# Patient Record
Sex: Male | Born: 1985 | Race: Black or African American | Hispanic: No | Marital: Single | Smoking: Never smoker
Health system: Southern US, Community
[De-identification: ages and names within clinical notes are randomized; demographics above are authoritative.]

---

## 2021-05-14 DIAGNOSIS — L910 Hypertrophic scar: Secondary | ICD-10-CM | POA: Diagnosis not present

## 2021-06-25 DIAGNOSIS — J101 Influenza due to other identified influenza virus with other respiratory manifestations: Secondary | ICD-10-CM | POA: Diagnosis not present

## 2021-06-25 DIAGNOSIS — R059 Cough, unspecified: Secondary | ICD-10-CM | POA: Diagnosis not present

## 2021-10-18 DIAGNOSIS — H1033 Unspecified acute conjunctivitis, bilateral: Secondary | ICD-10-CM | POA: Diagnosis not present

## 2021-10-18 DIAGNOSIS — H5789 Other specified disorders of eye and adnexa: Secondary | ICD-10-CM | POA: Diagnosis not present

## 2021-10-18 DIAGNOSIS — J069 Acute upper respiratory infection, unspecified: Secondary | ICD-10-CM | POA: Diagnosis not present

## 2021-10-18 DIAGNOSIS — R519 Headache, unspecified: Secondary | ICD-10-CM | POA: Diagnosis not present

## 2021-10-18 DIAGNOSIS — Z20822 Contact with and (suspected) exposure to covid-19: Secondary | ICD-10-CM | POA: Diagnosis not present

## 2021-10-25 ENCOUNTER — Encounter: Payer: Self-pay | Admitting: Emergency Medicine

## 2021-10-25 ENCOUNTER — Other Ambulatory Visit: Payer: Self-pay

## 2021-10-25 DIAGNOSIS — R110 Nausea: Secondary | ICD-10-CM | POA: Insufficient documentation

## 2021-10-25 DIAGNOSIS — R112 Nausea with vomiting, unspecified: Secondary | ICD-10-CM | POA: Diagnosis not present

## 2021-10-25 DIAGNOSIS — Z20822 Contact with and (suspected) exposure to covid-19: Secondary | ICD-10-CM | POA: Insufficient documentation

## 2021-10-25 DIAGNOSIS — H5789 Other specified disorders of eye and adnexa: Secondary | ICD-10-CM | POA: Diagnosis not present

## 2021-10-25 DIAGNOSIS — M545 Low back pain, unspecified: Secondary | ICD-10-CM | POA: Insufficient documentation

## 2021-10-25 DIAGNOSIS — H1033 Unspecified acute conjunctivitis, bilateral: Secondary | ICD-10-CM | POA: Insufficient documentation

## 2021-10-25 DIAGNOSIS — M7918 Myalgia, other site: Secondary | ICD-10-CM | POA: Diagnosis not present

## 2021-10-25 NOTE — ED Triage Notes (Signed)
Patient ambulatory to triage with steady gait, without difficulty or distress noted; pt reports for last wk having bilat eye redness, HA and bodyaches with some intermittent nausea ?

## 2021-10-26 ENCOUNTER — Emergency Department
Admission: EM | Admit: 2021-10-26 | Discharge: 2021-10-26 | Disposition: A | Discharge status: Home / Self Care | Payer: BC Managed Care – PPO | Attending: Emergency Medicine | Admitting: Emergency Medicine

## 2021-10-26 DIAGNOSIS — R112 Nausea with vomiting, unspecified: Secondary | ICD-10-CM | POA: Diagnosis not present

## 2021-10-26 DIAGNOSIS — R110 Nausea: Secondary | ICD-10-CM

## 2021-10-26 DIAGNOSIS — H1033 Unspecified acute conjunctivitis, bilateral: Secondary | ICD-10-CM | POA: Diagnosis not present

## 2021-10-26 LAB — RESP PANEL BY RT-PCR (FLU A&B, COVID) ARPGX2
Influenza A by PCR: NEGATIVE
Influenza B by PCR: NEGATIVE
SARS Coronavirus 2 by RT PCR: NEGATIVE

## 2021-10-26 MED ORDER — ONDANSETRON 4 MG PO TBDP: 4.0000 mg | ORAL_TABLET | Freq: Four times a day (QID) | ORAL | 0 refills | Status: DC | PRN

## 2021-10-26 MED ORDER — CETIRIZINE HCL 10 MG PO TABS: 10.0000 mg | ORAL_TABLET | Freq: Every day | ORAL | 2 refills | Status: AC

## 2021-10-26 MED ORDER — TETRACAINE HCL 0.5 % OP SOLN
2.0000 [drp] | Freq: Once | OPHTHALMIC | Status: AC
  Administered 2021-10-26: 2 [drp] via OPHTHALMIC
  Filled 2021-10-26: qty 4

## 2021-10-26 MED ORDER — FLUORESCEIN SODIUM 1 MG OP STRP
2.0000 | ORAL_STRIP | Freq: Once | OPHTHALMIC | Status: AC
  Administered 2021-10-26: 2 via OPHTHALMIC
  Filled 2021-10-26: qty 2

## 2021-10-26 NOTE — Discharge Instructions (Addendum)
Please continue your olopatadine 0.2% eyedrops, 1 drop in each eye once daily.  I recommend that you start Zyrtec daily.  Please call to schedule close follow-up with ophthalmology. ?

## 2021-10-26 NOTE — ED Notes (Addendum)
Woods lamp, tono pen, tetracaine, and opthalmic strips at the bedside ?

## 2021-10-26 NOTE — ED Provider Notes (Addendum)
? ?[facility] ?Provider Note ? ? ? Event Date/Time  ? First MD Initiated Contact with Patient 10/26/21 0050   ?  (approximate) ? ? ?History  ? ?Generalized Body Aches ? ? ?HPI ? ?Marcus Washington is a 36 y.o. male with no significant past medical history who presents to the emergency department with complaints of bilateral eye redness that has been ongoing for 2 weeks.  States he has seen tele doctor and urgent care.  Was placed on moxifloxacin drops that he finished recently.  States he was on about a week of this medication and noticed no improvement.  He denies any dry eyes, tearing, drainage.  States his eyes are burning but denies eye pain.  He denies light sensitivity.  He denies vision loss or vision changes.  He denies any injury to the eye or getting anything into his eyes recently.  He states he just picked up over-the-counter olopatadine drops and just started them yesterday.  He denies history of seasonal allergies.  Does not wear contacts or glasses.  No previous eye surgeries.  Patient states that he was seen by a physician by a tele visit who recommended that he come to the emergency department. ? ? ?States over the past couple of days he has also had body aches, lower back pain and nausea worse with eating.  No fevers, cough, sore throat, ear pain, vomiting, diarrhea, abdominal pain, dysuria, hematuria. ? ? ?History provided by patient. ? ? ? ?History reviewed. No pertinent past medical history. ? ?History reviewed. No pertinent surgical history. ? ?MEDICATIONS:  ?Prior to Admission medications   ?Not on File  ? ? ?Physical Exam  ? ?Triage Vital Signs: ?ED Triage Vitals  ?Enc Vitals Group  ?   BP 10/25/21 2333 129/70  ?   Pulse Rate 10/25/21 2333 97  ?   Resp 10/25/21 2333 18  ?   Temp 10/25/21 2333 98.3 ?F (36.8 ?C)  ?   Temp Source 10/25/21 2333 Oral  ?   SpO2 10/25/21 2333 99 %  ?   Weight 10/25/21 2350 160 lb (72.6 kg)  ?   Height 10/25/21 2350 5\' 4"  (1.626 m)  ?   Head  Circumference --   ?   Peak Flow --   ?   Pain Score 10/25/21 2347 2  ?   Pain Loc --   ?   Pain Edu? --   ?   Excl. in GC? --   ? ? ?Most recent vital signs: ?Vitals:  ? 10/26/21 0130 10/26/21 0200  ?BP: 116/67 100/68  ?Pulse: 86 82  ?Resp: 15 17  ?Temp:    ?SpO2: 97% 96%  ? ? ?CONSTITUTIONAL: Alert and oriented and responds appropriately to questions. Well-appearing; well-nourished ?HEAD: Normocephalic, atraumatic ?EYES: Patient has bilateral injection of his conjunctiva.  No hypopyon or hyphema.  No chemosis.  No foreign body appreciated.  No fluorescein uptake.  Intraocular pressure of the right eye is 10 mmHg.  Intraocular pressure of the left eye is 8 mm Hg. Funduscopic exam appears normal.  Normal-appearing optic disc.  No papilledema.  Please see nursing notes for visual acuity.  No proptosis.  No periorbital swelling, redness, ecchymosis.  Extraocular movements intact and no pain with movement of the eye.  No pain with consensual light response.  No photophobia.  Pupils are equal and reactive to light.  No ciliary flush. ?ENT: normal nose; moist mucous membranes; No pharyngeal erythema or petechiae, no tonsillar hypertrophy or exudate,  no uvular deviation, no unilateral swelling in posterior oropharynx, no trismus or drooling, no muffled voice, normal phonation, no stridor, airway patent.  TMs are clear bilaterally without erythema, purulence, bulging, perforation, effusion.  No cerumen impaction or sign of foreign body in the external auditory canal. No inflammation, erythema or drainage from the external auditory canal. No signs of mastoiditis. No pain with manipulation of the pinna bilaterally. ?NECK: Supple, normal ROM no meningismus ?CARD: RRR; S1 and S2 appreciated; no murmurs, no clicks, no rubs, no gallops ?RESP: Normal chest excursion without splinting or tachypnea; breath sounds clear and equal bilaterally; no wheezes, no rhonchi, no rales, no hypoxia or respiratory distress, speaking full  sentences ?ABD/GI: Normal bowel sounds; non-distended; soft, non-tender, no rebound, no guarding, no peritoneal signs, no tenderness at McBurney's point ?BACK: The back appears normal ?EXT: Normal ROM in all joints; no deformity noted, no edema; no cyanosis ?SKIN: Normal color for age and race; warm; no rash on exposed skin ?NEURO: Moves all extremities equally, normal speech, ambulates with normal gait ?PSYCH: The patient's mood and manner are appropriate. ? ? ?ED Results / Procedures / Treatments  ? ?LABS: ?(all labs ordered are listed, but only abnormal results are displayed) ?Labs Reviewed  ?RESP PANEL BY RT-PCR (FLU A&B, COVID) [id number]  ? ? ? ?EKG: ? ? ? ? ?RADIOLOGY: ?My personal review and interpretation of imaging:   ? ?I have personally reviewed all radiology reports.   ?No results found. ? ? ?PROCEDURES: ? ?Critical Care performed: No ? ? ?CRITICAL CARE ?Performed by: Baxter HireKristen Red Mandt ? ? ?Total critical care time: 0 minutes ? ?Critical care time was exclusive of separately billable procedures and treating other patients. ? ?Critical care was necessary to treat or prevent imminent or life-threatening deterioration. ? ?Critical care was time spent personally by me on the following activities: development of treatment plan with patient and/or surrogate as well as nursing, discussions with consultants, evaluation of patient's response to treatment, examination of patient, obtaining history from patient or surrogate, ordering and performing treatments and interventions, ordering and review of laboratory studies, ordering and review of radiographic studies, pulse oximetry and re-evaluation of patient's condition. ? ? ?Procedures ? ? ? ?IMPRESSION / MDM / ASSESSMENT AND PLAN / ED COURSE  ?I reviewed the triage vital signs and the nursing notes. ? ? ? ?Patient here with bilateral conjunctivitis for 2 weeks.  Now also having body aches, low back pain, nausea. ? ? ? ?DIFFERENTIAL DIAGNOSIS (includes but not limited to):    Allergic conjunctivitis, viral conjunctivitis, bacterial conjunctivitis, viral illness.  Doubt glaucoma, retinal detachment, central retinal artery or vein occlusion.  Differential also includes iritis abdominal exam benign.  Doubt appendicitis, colitis, diverticulitis, bowel obstruction, cholecystitis, pancreatitis, UTI, kidney stone, pyelonephritis. ? ? ?PLAN: We will perform eye exam for further evaluation.  I suspect that he has allergic conjunctivitis.  He has no drainage from the eyes and has completed a course of topical antibiotics without any improvement. ? ?Patient states he is concerned that something "internal" is going on now that he is having body aches, back pain and nausea.  His exam today is reassuring and he is afebrile, nontoxic.  Will obtain COVID and flu swab.  Have offered labs, urine but he declines. ? ? ?MEDICATIONS GIVEN IN ED: ?Medications  ?fluorescein ophthalmic strip 2 strip (2 strips Both Eyes Given 10/26/21 0121)  ?tetracaine (PONTOCAINE) 0.5 % ophthalmic solution 2 drop (2 drops Both Eyes Given 10/26/21 0121)  ? ? ? ?ED  COURSE: Patient's eye exam shows no significant abnormality.  Normal intraocular pressure.  No fluorescein uptake.  Normal funduscopic exam.  No photophobia, flashes or floaters, vision loss.  My suspicion for iritis, uveitis is low but is on the differential and I have recommended very close follow-up with ophthalmology.  He has a prescription that he has not yet filled for prednisolone drops that was prescribed by the tele doc.  I recommended that he continue olopatadine drops which he just started yesterday and will discharge him on oral antihistamines as well.  Recommended Benadryl as well.   ? ?As for his body aches, lower back pain and nausea, again have offered labs and urine today which he declines.  His abdominal exam is benign.  He is afebrile here.  His COVID and flu swabs are negative.  We discussed that he could have a viral illness causing symptoms.  I do  not think that he is septic, bacteremic.  Doubt meningitis, pneumonia.  Have recommended follow-up with his primary care doctor if symptoms continue.  Will discharge with Zofran.  He declines any medication

## 2021-10-26 NOTE — ED Notes (Signed)
Dr. Ward at the bedside.  

## 2021-10-27 DIAGNOSIS — B309 Viral conjunctivitis, unspecified: Secondary | ICD-10-CM | POA: Diagnosis not present

## 2021-11-07 NOTE — Progress Notes (Deleted)
? ?  New Patient Office Visit ? ?Subjective:  ?Patient ID: Marcus Washington, male    DOB: [birth year]  Age: 36 y.o. MRN: [id number] ? ?CC: No chief complaint on file. ? ? ?HPI ?Marcus Washington presents for *** ? ?No past medical history on file. ? ?No past surgical history on file. ? ?No family history on file. ? ?Social History  ? ?Socioeconomic History  ? Marital status: Single  ?  Spouse name: Not on file  ? Number of children: Not on file  ? Years of education: Not on file  ? Highest education level: Not on file  ?Occupational History  ? Not on file  ?Tobacco Use  ? Smoking status: Never  ? Smokeless tobacco: Never  ?Vaping Use  ? Vaping Use: Never used  ?Substance and Sexual Activity  ? Alcohol use: Not on file  ? Drug use: Not on file  ? Sexual activity: Not on file  ?Other Topics Concern  ? Not on file  ?Social History Narrative  ? Not on file  ? ?Social Determinants of Health  ? ?Financial Resource Strain: Not on file  ?Food Insecurity: Not on file  ?Transportation Needs: Not on file  ?Physical Activity: Not on file  ?Stress: Not on file  ?Social Connections: Not on file  ?Intimate Partner Violence: Not on file  ? ? ?ROS ?Review of Systems ? ?Objective:  ? ?Today's Vitals: There were no vitals taken for this visit. ? ?Physical Exam ? ?Assessment & Plan:  ? ?Problem List Items Addressed This Visit   ?None ? ? ?Outpatient Encounter Medications as of 11/10/2021  ?Medication Sig  ? cetirizine (ZYRTEC ALLERGY) 10 MG tablet Take 1 tablet (10 mg total) by mouth daily.  ? ondansetron (ZOFRAN-ODT) 4 MG disintegrating tablet Take 1 tablet (4 mg total) by mouth every 6 (six) hours as needed for nausea or vomiting.  ? ?No facility-administered encounter medications on file as of 11/10/2021.  ? ? ?Follow-up: No follow-ups on file.  ? ?Debera Lat, PA-C ? ?

## 2021-11-10 ENCOUNTER — Encounter: Payer: Self-pay | Admitting: Physician Assistant

## 2021-11-10 ENCOUNTER — Ambulatory Visit
Admission: RE | Admit: 2021-11-10 | Discharge: 2021-11-10 | Disposition: A | Discharge status: Home / Self Care | Payer: BC Managed Care – PPO | Attending: Physician Assistant | Admitting: Physician Assistant

## 2021-11-10 ENCOUNTER — Ambulatory Visit
Admission: RE | Admit: 2021-11-10 | Discharge: 2021-11-10 | Disposition: A | Discharge status: Home / Self Care | Payer: BC Managed Care – PPO | Source: Ambulatory Visit | Attending: Physician Assistant | Admitting: Physician Assistant

## 2021-11-10 ENCOUNTER — Ambulatory Visit: Payer: BC Managed Care – PPO | Admitting: Physician Assistant

## 2021-11-10 ENCOUNTER — Other Ambulatory Visit: Payer: Self-pay

## 2021-11-10 VITALS — BP 115/58 | HR 83 | Temp 98.2°F | Resp 14 | Ht 64.0 in | Wt 157.1 lb

## 2021-11-10 DIAGNOSIS — H5710 Ocular pain, unspecified eye: Secondary | ICD-10-CM

## 2021-11-10 DIAGNOSIS — R059 Cough, unspecified: Secondary | ICD-10-CM

## 2021-11-10 DIAGNOSIS — R519 Headache, unspecified: Secondary | ICD-10-CM | POA: Diagnosis not present

## 2021-11-10 MED ORDER — PREDNISONE 20 MG PO TABS: 20.0000 mg | ORAL_TABLET | Freq: Every day | ORAL | 0 refills | Status: DC

## 2021-11-10 NOTE — Progress Notes (Signed)
? ?New Patient Office Visit ? ?I,Marcus Washington,acting as a Education administrator for [employer], PA-C.,have documented all relevant documentation on the behalf of Marcus Speak, PA-C,as directed by  [employer], PA-C while in the presence of [employer], PA-C.  ? ?Subjective:  ?Patient ID: Marcus Washington, male    DOB: 03-11-1986  Age: 36 y.o. MRN: [id number] ? ? ?--------------------------------------------------------------------------------------------------- ?Patient presents today to establish care.  He is c/o cough, red eyes, migraines.   ? ?Cough x Nov '22 and is worsening, reports it is non-productive.  Reports it is mainly when he speaks. ? ?Patient uses inhaler, Rx cough medicine Brom/Pseudoephed-Bromphem-MD, taking Amoxicillin currently. He was treated for sinusitis. No chart for this visit in Epic. ? ?Eye redness: Reports both eyes are bloodshot and painful x 4 wks.  Patient reports no injury and nothing coming in contact with eyes.  Patient denies any discharge, any vision loss, double or blurry vision. Patient denies any weakness, slurry speech.   Per patient, he saw an eye doctor across the street from [facility] and was diagnosed with conjunctivitis, prescribed eye drops. Patient is using 3 otc drops Lumify, Olopatadine and Refresh with no relief. ? ?Per chart review, he was seen at the ED on 10/26/21 with c/o red eyes, body aches, lower back pain and nausea. ?Patient's eye exam showed no significant abnormality with normal intraocular pressure and normal funduscopic exam. He was discharged on olopatadine drops and oral antihistamines as well as Benadryl. ?Patient was offered labs and urine which he declines.  His abdominal exam was benign.  His COVID and flu swabs were negative.  ED provider suspected that he could have a viral illness causing symptoms. He was discharged with Zofran.  He declined any medication there.  ?  ?Headaches: Reports "migraine" which started out as a h/a and intermittent and increased in  severity.  ?"Migraine" and red eyes started together. However, he reported having headache only today. Has a hx of aseptic meningitis. Denies having simialr headache today. Denies neck stiffness, or any prior injury, head trauma. ?Location: "temples" ?Radiation: no ?Confusion:  no ?Gait disturbance/ataxia:  no ?Behavioral changes:  no ?Fevers:  no ?Reports he has been seen at Urgent Care, telehealth and 1 ER visit and eye doctor within the past month with no relief of symptoms.   ?Patient did not provide [place] with documentation from his visits. ? ?Marcus Washington presents as new patient to establish care. Patient c/o cough, red eyes and migraines.   ? ?History reviewed. No pertinent past medical history. ? ?History reviewed. No pertinent surgical history. ? ?History reviewed. No pertinent family history. ? ?Social History  ? ?Socioeconomic History  ? Marital status: Single  ?  Spouse name: Not on file  ? Number of children: Not on file  ? Years of education: Not on file  ? Highest education level: Not on file  ?Occupational History  ? Not on file  ?Tobacco Use  ? Smoking status: Never  ? Smokeless tobacco: Never  ?Vaping Use  ? Vaping Use: Never used  ?Substance and Sexual Activity  ? Alcohol use: Never  ? Drug use: Not on file  ? Sexual activity: Not on file  ?Other Topics Concern  ? Not on file  ?Social History Narrative  ? Not on file  ? ?Social Determinants of Health  ? ?Financial Resource Strain: Not on file  ?Food Insecurity: Not on file  ?Transportation Needs: Not on file  ?Physical Activity: Not on file  ?Stress: Not on file  ?  Social Connections: Not on file  ?Intimate Partner Violence: Not on file  ? ? ?ROS ?Review of Systems  ?Eyes:  Positive for pain and redness.  ?Respiratory:  Positive for cough.   ?Neurological:  Positive for headaches. Negative for dizziness, tremors, seizures, syncope, facial asymmetry, speech difficulty, weakness, light-headedness and numbness.  ? ?Objective:  ? ?Today's Vitals: BP  (!) 115/58 (BP Location: Right Arm, Patient Position: Sitting, Cuff Size: Normal)   Pulse 83   Temp 98.2 ?F (36.8 ?C) (Oral)   Resp 14   Ht $R'5\' 4"'FE$  (1.626 m)   Wt 157 lb 1.6 oz (71.3 kg)   SpO2 98%   BMI 26.97 kg/m?  ? ?Physical Exam ?Vitals and nursing note reviewed.  ?Constitutional:   ?   Appearance: Normal appearance.  ?HENT:  ?   Head: Normocephalic and atraumatic.  ?   Right Ear: Tympanic membrane normal.  ?   Left Ear: Tympanic membrane normal.  ?   Nose: Rhinorrhea present.  ?   Mouth/Throat:  ?   Mouth: Mucous membranes are moist.  ?   Pharynx: No posterior oropharyngeal erythema.  ?Eyes:  ?   General:     ?   Right eye: No discharge.     ?   Left eye: No discharge.  ?   Extraocular Movements: Extraocular movements intact.  ?   Pupils: Pupils are equal, round, and reactive to light.  ?   Comments: Injected conjunctiva bilaterally.  ?Cardiovascular:  ?   Rate and Rhythm: Normal rate and regular rhythm.  ?   Pulses: Normal pulses.  ?Pulmonary:  ?   Effort: Pulmonary effort is normal.  ?   Breath sounds: Normal breath sounds.  ?Neurological:  ?   General: No focal deficit present.  ?   Mental Status: He is alert and oriented to person, place, and time.  ?   Cranial Nerves: No cranial nerve deficit.  ?   Sensory: No sensory deficit.  ?   Motor: No weakness.  ?   Coordination: Coordination normal.  ?   Gait: Gait normal.  ?   Deep Tendon Reflexes: Reflexes normal.  ?Psychiatric:     ?   Behavior: Behavior normal.     ?   Thought Content: Thought content normal.     ?   Judgment: Judgment normal.  ? ? ?Assessment & Plan:  ? ?Problem List Items Addressed This Visit   ?None ?Visit Diagnoses   ? ? Pain in eye, unspecified laterality    -  Primary  ? Relevant Orders  ? TSH (Completed)  ? Nonintractable headache, unspecified chronicity pattern, unspecified headache type      ? Relevant Medications  ? cyclobenzaprine (FLEXERIL) 10 MG tablet  ? naproxen (NAPROSYN) 500 MG tablet  ? SUMAtriptan (IMITREX) 50 MG tablet   ? Other Relevant Orders  ? Comprehensive Metabolic Panel (CMET) (Completed)  ? Lipid Profile (Completed)  ? Cough in adult patient      ? Relevant Medications  ? predniSONE (DELTASONE) 20 MG tablet  ? Other Relevant Orders  ? DG Chest 2 View (Completed)  ? CBC w/Diff/Platelet (Completed)  ? TSH (Completed)  ? Sed Rate (ESR) (Completed)  ? ?  ? ?1. Nonintractable headache, unspecified chronicity pattern, unspecified headache type ?- Comprehensive Metabolic Panel (CMET) ?- Lipid Profile ?- Might consider referral to Neurology or CT if patient will agree ? ?2. Cough in adult patient ?Chronic ?- DG Chest 2 View; Future ?- CBC w/Diff/Platelet ?-  TSH ?- Sed Rate (ESR) ?- predniSONE (DELTASONE) 20 MG tablet; Take 1 tablet (20 mg total) by mouth daily with breakfast.  Dispense: 5 tablet; Refill: 0 ? ?3. Pain in eye, unspecified laterality ?- continue prescribed eye drops ?- Might need referral to ophthalmology if patient  will agree ? ?Outpatient Encounter Medications as of 11/10/2021  ?Medication Sig  ? predniSONE (DELTASONE) 20 MG tablet Take 1 tablet (20 mg total) by mouth daily with breakfast.  ? albuterol (VENTOLIN HFA) 108 (90 Base) MCG/ACT inhaler SMARTSIG:2 Puff(s) By Mouth Every 4-6 Hours PRN  ? amoxicillin-clavulanate (AUGMENTIN) 875-125 MG tablet Take 1 tablet by mouth 2 (two) times daily.  ? brompheniramine-pseudoephedrine-DM 30-2-10 MG/5ML syrup SMARTSIG:5-10 Milliliter(s) By Mouth Every 6 Hours PRN  ? cetirizine (ZYRTEC ALLERGY) 10 MG tablet Take 1 tablet (10 mg total) by mouth daily.  ? cyclobenzaprine (FLEXERIL) 10 MG tablet Take 10 mg by mouth 3 (three) times daily as needed.  ? fluticasone (FLONASE) 50 MCG/ACT nasal spray   ? moxifloxacin (VIGAMOX) 0.5 % ophthalmic solution Apply 1 drop to eye 3 (three) times daily.  ? naproxen (NAPROSYN) 500 MG tablet SMARTSIG:1 Tablet(s) By Mouth Every 12 Hours PRN  ? SUMAtriptan (IMITREX) 50 MG tablet Take 50 mg by mouth 2 (two) times daily as needed.  ? [DISCONTINUED]  ondansetron (ZOFRAN-ODT) 4 MG disintegrating tablet Take 1 tablet (4 mg total) by mouth every 6 (six) hours as needed for nausea or vomiting.  ? ?No facility-administered encounter medications on file as of

## 2021-11-11 ENCOUNTER — Telehealth: Payer: Self-pay | Admitting: Physician Assistant

## 2021-11-11 LAB — CBC WITH DIFFERENTIAL/PLATELET
Basophils Absolute: 0 10*3/uL (ref 0.0–0.2)
Basos: 1 %
EOS (ABSOLUTE): 0.1 10*3/uL (ref 0.0–0.4)
Eos: 2 %
Hematocrit: 43.2 % (ref 37.5–51.0)
Hemoglobin: 14.6 g/dL (ref 13.0–17.7)
Immature Grans (Abs): 0 10*3/uL (ref 0.0–0.1)
Immature Granulocytes: 0 %
Lymphocytes Absolute: 1.2 10*3/uL (ref 0.7–3.1)
Lymphs: 20 %
MCH: 28 pg (ref 26.6–33.0)
MCHC: 33.8 g/dL (ref 31.5–35.7)
MCV: 83 fL (ref 79–97)
Monocytes Absolute: 0.6 10*3/uL (ref 0.1–0.9)
Monocytes: 9 %
Neutrophils Absolute: 4.2 10*3/uL (ref 1.4–7.0)
Neutrophils: 68 %
Platelets: 380 10*3/uL (ref 150–450)
RBC: 5.22 x10E6/uL (ref 4.14–5.80)
RDW: 11.8 % (ref 11.6–15.4)
WBC: 6.2 10*3/uL (ref 3.4–10.8)

## 2021-11-11 LAB — COMPREHENSIVE METABOLIC PANEL
ALT: 60 IU/L — ABNORMAL HIGH (ref 0–44)
AST: 32 IU/L (ref 0–40)
Albumin/Globulin Ratio: 1.1 — ABNORMAL LOW (ref 1.2–2.2)
Albumin: 4.2 g/dL (ref 4.0–5.0)
Alkaline Phosphatase: 101 IU/L (ref 44–121)
BUN/Creatinine Ratio: 10 (ref 9–20)
BUN: 12 mg/dL (ref 6–20)
Bilirubin Total: <0.2 mg/dL (ref 0.0–1.2)
CO2: 27 mmol/L (ref 20–29)
Calcium: 10 mg/dL (ref 8.7–10.2)
Chloride: 99 mmol/L (ref 96–106)
Creatinine, Ser: 1.17 mg/dL (ref 0.76–1.27)
Globulin, Total: 3.7 g/dL (ref 1.5–4.5)
Glucose: 84 mg/dL (ref 70–99)
Potassium: 4.7 mmol/L (ref 3.5–5.2)
Sodium: 140 mmol/L (ref 134–144)
Total Protein: 7.9 g/dL (ref 6.0–8.5)
eGFR: 83 mL/min/{1.73_m2} (ref >59)

## 2021-11-11 LAB — LIPID PANEL
Chol/HDL Ratio: 6.6 ratio — ABNORMAL HIGH (ref 0.0–5.0)
Cholesterol, Total: 205 mg/dL — ABNORMAL HIGH (ref 100–199)
HDL: 31 mg/dL — ABNORMAL LOW (ref >39)
LDL Chol Calc (NIH): 152 mg/dL — ABNORMAL HIGH (ref 0–99)
Triglycerides: 119 mg/dL (ref 0–149)
VLDL Cholesterol Cal: 22 mg/dL (ref 5–40)

## 2021-11-11 LAB — SEDIMENTATION RATE: Sed Rate: 55 mm/hr — ABNORMAL HIGH (ref 0–15)

## 2021-11-11 LAB — TSH: TSH: 1.6 u[IU]/mL (ref 0.450–4.500)

## 2021-11-11 NOTE — Telephone Encounter (Signed)
Spoke with patient about his lab results. All the lab results except sed rate , LFT, LDL and HDL (slightly elevated/decreased) WNL. We established pat's baseline.  ?Marcus Washington is advised to continue with his current medication regimen, diet and exercise routine. ?Marcus Washington expressed his understanding and agreed to the current management plan. ?

## 2021-11-12 ENCOUNTER — Telehealth: Payer: Self-pay

## 2021-11-12 NOTE — Telephone Encounter (Signed)
Pt called to inquire about lab results. Please advise 

## 2021-11-12 NOTE — Telephone Encounter (Signed)
Pt called to follow up on labs and results for his chest x-ray. Pt stated he had other questions and concerns about labs and felt there may be a language barrier with PCP and is requesting a call back if possible today.  ? ?Please advise. ?

## 2021-11-14 DIAGNOSIS — H5710 Ocular pain, unspecified eye: Secondary | ICD-10-CM | POA: Insufficient documentation

## 2021-11-14 DIAGNOSIS — R059 Cough, unspecified: Secondary | ICD-10-CM | POA: Insufficient documentation

## 2021-11-14 DIAGNOSIS — R519 Headache, unspecified: Secondary | ICD-10-CM | POA: Insufficient documentation

## 2021-11-15 ENCOUNTER — Telehealth: Payer: Self-pay

## 2021-11-15 NOTE — Telephone Encounter (Signed)
Copied from CRM [id number]. Topic: General - Other ?>> Nov 15, 2021  9:31 AM Marcus Washington wrote: ?Reason for CRM: Pt called to reschedule his appt and asked if he can switch provider due to language barrier / please advise ?

## 2021-11-16 ENCOUNTER — Ambulatory Visit: Payer: BC Managed Care – PPO | Admitting: Physician Assistant

## 2021-11-16 NOTE — Telephone Encounter (Signed)
Patient spoke with  [name] , per my request and was asked if he is doing better.  Patient just completed a course of prednisone, he was Rxed prednisone 20 mg x 5 days ?His headache and eye pain and cough improved. He still has "red eyes" ?Patient was seen at [facility] on 11/02/21 and was diagnosed with possible conjuctivitis vs glaucoma vs keratitis. He does not have vision loss. ?He endorses having "temple" headache and eye problems without vision loss. Ddx for possibility of junior temporal arthritis. ?However, improving on prednisone and conclusion of ophthalmologist ? ?

## 2021-11-23 ENCOUNTER — Ambulatory Visit: Payer: BC Managed Care – PPO | Admitting: Family Medicine

## 2021-11-23 ENCOUNTER — Encounter: Payer: Self-pay | Admitting: Family Medicine

## 2021-11-23 VITALS — BP 114/61 | HR 85 | Temp 97.5°F | Resp 16 | Wt 155.1 lb

## 2021-11-23 DIAGNOSIS — B309 Viral conjunctivitis, unspecified: Secondary | ICD-10-CM | POA: Insufficient documentation

## 2021-11-23 DIAGNOSIS — Z114 Encounter for screening for human immunodeficiency virus [HIV]: Secondary | ICD-10-CM

## 2021-11-23 DIAGNOSIS — K219 Gastro-esophageal reflux disease without esophagitis: Secondary | ICD-10-CM | POA: Diagnosis not present

## 2021-11-23 DIAGNOSIS — R700 Elevated erythrocyte sedimentation rate: Secondary | ICD-10-CM | POA: Diagnosis not present

## 2021-11-23 DIAGNOSIS — Z1159 Encounter for screening for other viral diseases: Secondary | ICD-10-CM | POA: Insufficient documentation

## 2021-11-23 DIAGNOSIS — R053 Chronic cough: Secondary | ICD-10-CM | POA: Insufficient documentation

## 2021-11-23 HISTORY — DX: Encounter for screening for human immunodeficiency virus (HIV): Z11.4

## 2021-11-23 HISTORY — DX: Gastro-esophageal reflux disease without esophagitis: K21.9

## 2021-11-23 MED ORDER — OMEPRAZOLE 40 MG PO CPDR: 40.0000 mg | DELAYED_RELEASE_CAPSULE | Freq: Two times a day (BID) | ORAL | 0 refills | Status: AC

## 2021-11-23 NOTE — Assessment & Plan Note (Signed)
Presumed, d/t chronic cough > 5 months with no improvement with inhaled steroids or use of antihistamine, Zyrtec ?Silent GERD ?Will start 14 day course prilosec to assist ?Patient to report improvement/lack there of to PCP ?

## 2021-11-23 NOTE — Assessment & Plan Note (Addendum)
Unknown cause, may be associated with red eyes and cough/silent reflux ?Will check additional labs today ?Hx of aseptic meningitis and knee pain, large cyst, non painful remains behind L knee; present > 5 years  ?

## 2021-11-23 NOTE — Progress Notes (Signed)
?  ?[employer] as a Education administrator for Gwyneth Sprout, FNP.,have documented all relevant documentation on the behalf of Gwyneth Sprout, FNP,as directed by  Gwyneth Sprout, FNP while in the presence of Gwyneth Sprout, FNP.  ? ?Established patient visit ? ?Patient: Marcus Washington   DOB: [birth year]   36 y.o. Male  MRN: [id number] ?Visit Date: 11/23/2021 ? ?Today's healthcare provider: Gwyneth Sprout, FNP  ?Vonna Kotyk, FNP, have reviewed all documentation for this visit. The documentation on 11/23/21 for the exam, diagnosis, procedures, and orders are all accurate and complete. ? ?Chief Complaint  ?Patient presents with  ? Eye Redness  ? ?Subjective  ?  ?Eye Problem  ?Both eyes are affected. The current episode started more than 1 month ago. The problem occurs constantly. There was no injury mechanism. The pain is moderate. There is No known exposure to pink eye. He Does not wear contacts. Associated symptoms include eye redness. Pertinent negatives include no blurred vision, eye discharge, double vision, fever, foreign body sensation, itching, nausea, photophobia, recent URI or vomiting. He has tried eye drops for the symptoms. The treatment provided no relief.   ? ?Medications: ?Outpatient Medications Prior to Visit  ?Medication Sig  ? cetirizine (ZYRTEC ALLERGY) 10 MG tablet Take 1 tablet (10 mg total) by mouth daily.  ? [DISCONTINUED] albuterol (VENTOLIN HFA) 108 (90 Base) MCG/ACT inhaler SMARTSIG:2 Puff(s) By Mouth Every 4-6 Hours PRN (Patient not taking: Reported on 11/23/2021)  ? [DISCONTINUED] amoxicillin-clavulanate (AUGMENTIN) 875-125 MG tablet Take 1 tablet by mouth 2 (two) times daily.  ? [DISCONTINUED] brompheniramine-pseudoephedrine-DM 30-2-10 MG/5ML syrup SMARTSIG:5-10 Milliliter(s) By Mouth Every 6 Hours PRN (Patient not taking: Reported on 11/23/2021)  ? [DISCONTINUED] cyclobenzaprine (FLEXERIL) 10 MG tablet Take 10 mg by mouth 3 (three) times daily as needed. (Patient not taking: Reported on  11/23/2021)  ? [DISCONTINUED] fluticasone (FLONASE) 50 MCG/ACT nasal spray  (Patient not taking: Reported on 11/23/2021)  ? [DISCONTINUED] moxifloxacin (VIGAMOX) 0.5 % ophthalmic solution Apply 1 drop to eye 3 (three) times daily.  ? [DISCONTINUED] naproxen (NAPROSYN) 500 MG tablet SMARTSIG:1 Tablet(s) By Mouth Every 12 Hours PRN (Patient not taking: Reported on 11/23/2021)  ? [DISCONTINUED] predniSONE (DELTASONE) 20 MG tablet Take 1 tablet (20 mg total) by mouth daily with breakfast.  ? [DISCONTINUED] SUMAtriptan (IMITREX) 50 MG tablet Take 50 mg by mouth 2 (two) times daily as needed. (Patient not taking: Reported on 11/23/2021)  ? ?No facility-administered medications prior to visit.  ? ? ?Review of Systems  ?Constitutional:  Negative for fever.  ?Eyes:  Positive for redness. Negative for blurred vision, double vision, photophobia, discharge and itching.  ?Gastrointestinal:  Negative for nausea and vomiting.  ? ? ?  Objective  ?  ?BP 114/61   Pulse 85   Temp (!) 97.5 ?F (36.4 ?C) (Temporal)   Resp 16   Wt 155 lb 1.6 oz (70.4 kg)   SpO2 98%   BMI 26.62 kg/m?  ? ? ?Physical Exam ?Vitals and nursing note reviewed.  ?Constitutional:   ?   General: He is not in acute distress. ?   Appearance: Normal appearance. He is overweight. He is not ill-appearing, toxic-appearing or diaphoretic.  ?HENT:  ?   Head: Normocephalic and atraumatic.  ?   Nose: Nose normal.  ?   Mouth/Throat:  ?   Mouth: Mucous membranes are moist.  ?   Pharynx: Posterior oropharyngeal erythema present.  ?Eyes:  ?   General: Lids are normal. Vision grossly  intact. Gaze aligned appropriately. No allergic shiner or visual field deficit. ?   Conjunctiva/sclera:  ?   Right eye: Right conjunctiva is injected. No chemosis, exudate or hemorrhage. ?   Left eye: Left conjunctiva is injected. No chemosis, exudate or hemorrhage. ?   Pupils: Pupils are equal, round, and reactive to light.  ? ?Cardiovascular:  ?   Rate and Rhythm: Normal rate and regular rhythm.   ?   Pulses: Normal pulses.  ?   Heart sounds: Normal heart sounds.  ?Pulmonary:  ?   Effort: Pulmonary effort is normal.  ?   Breath sounds: Normal breath sounds.  ?Musculoskeletal:     ?   General: Normal range of motion.  ?   Cervical back: Normal range of motion.  ?Skin: ?   General: Skin is warm and dry.  ?   Capillary Refill: Capillary refill takes less than 2 seconds.  ?Neurological:  ?   General: No focal deficit present.  ?   Mental Status: He is alert and oriented to person, place, and time. Mental status is at baseline.  ?  ? ?No results found for any visits on 11/23/21. ? Assessment & Plan  ?  ? ?Problem List Items Addressed This Visit   ? ?  ? Digestive  ? Gastroesophageal reflux disease without esophagitis  ?  Presumed, d/t chronic cough > 5 months with no improvement with inhaled steroids or use of antihistamine, Zyrtec ?Silent GERD ?Will start 14 day course prilosec to assist ?Patient to report improvement/lack there of to PCP ?  ?  ? Relevant Medications  ? omeprazole (PRILOSEC) 40 MG capsule  ?  ? Other  ? Elevated erythrocyte sedimentation rate - Primary  ?  Unknown cause, may be associated with red eyes and cough/silent reflux ?Will check additional labs today ?Hx of aseptic meningitis and knee pain, large cyst, non painful remains behind L knee; present > 5 years  ?  ?  ? Relevant Orders  ? Lyme disease dna by pcr(borrelia burg)  ? Lyme Disease Serology w/Reflex  ? CBC with Differential/Platelet  ? Comprehensive Metabolic Panel (CMET)  ? Sed Rate (ESR)  ? Rheumatoid Factor  ? CYCLIC CITRUL PEPTIDE ANTIBODY, IGG/IGA  ? ANA Direct w/Reflex if Positive  ? Encounter for hepatitis C screening test for low risk patient  ?  Low risk screen ?Treatable, and curable. If left untreated Hep C can lead to cirrhosis and liver failure. ?PCP encourages routine testing; recommend additional testing if risk factors change. ? ?  ?  ? Relevant Orders  ? Hepatitis C Antibody  ? Encounter for screening for HIV  ?  Relevant Orders  ? HIV antibody (with reflex)  ? Viral conjunctivitis  ?  Presumed; did not improve with ABX eye drops, did not improve with allergy eye drops ?Denies sick contacts ?Unknown virus ?Will check for lyme and generalized auto immune concerns ?Denies change in vision ?Denies exudate ?  ?  ? ? ? ?Return in about 4 weeks (around 12/21/2021), or if symptoms worsen or fail to improve.  ?   ? ?I, Gwyneth Sprout, FNP, have reviewed all documentation for this visit. The documentation on 11/23/21 for the exam, diagnosis, procedures, and orders are all accurate and complete. ? ? ?Gwyneth Sprout, FNP  ?[place] ?[phone] (phone) ?[phone] (fax) ? ?[facility] ?

## 2021-11-23 NOTE — Assessment & Plan Note (Signed)
Low risk screen ?Treatable, and curable. If left untreated Hep C can lead to cirrhosis and liver failure. ?PCP encourages routine testing; recommend additional testing if risk factors change. ? ?

## 2021-11-23 NOTE — Assessment & Plan Note (Signed)
Presumed; did not improve with ABX eye drops, did not improve with allergy eye drops ?Denies sick contacts ?Unknown virus ?Will check for lyme and generalized auto immune concerns ?Denies change in vision ?Denies exudate ?

## 2021-11-24 ENCOUNTER — Other Ambulatory Visit: Payer: Self-pay | Admitting: Family Medicine

## 2021-11-24 ENCOUNTER — Ambulatory Visit: Payer: BC Managed Care – PPO | Admitting: Physician Assistant

## 2021-11-24 ENCOUNTER — Encounter: Payer: Self-pay | Admitting: Family Medicine

## 2021-11-24 DIAGNOSIS — M7122 Synovial cyst of popliteal space [Baker], left knee: Secondary | ICD-10-CM

## 2021-11-24 DIAGNOSIS — Z8241 Family history of sudden cardiac death: Secondary | ICD-10-CM

## 2021-11-25 ENCOUNTER — Other Ambulatory Visit: Payer: Self-pay | Admitting: Family Medicine

## 2021-11-25 DIAGNOSIS — H15103 Unspecified episcleritis, bilateral: Secondary | ICD-10-CM | POA: Insufficient documentation

## 2021-11-25 DIAGNOSIS — R700 Elevated erythrocyte sedimentation rate: Secondary | ICD-10-CM

## 2021-11-29 LAB — CBC WITH DIFFERENTIAL/PLATELET
Basophils Absolute: 0 10*3/uL (ref 0.0–0.2)
Basos: 1 %
EOS (ABSOLUTE): 0.1 10*3/uL (ref 0.0–0.4)
Eos: 2 %
Hematocrit: 42.3 % (ref 37.5–51.0)
Hemoglobin: 14.4 g/dL (ref 13.0–17.7)
Immature Grans (Abs): 0 10*3/uL (ref 0.0–0.1)
Immature Granulocytes: 0 %
Lymphocytes Absolute: 1.4 10*3/uL (ref 0.7–3.1)
Lymphs: 27 %
MCH: 27.9 pg (ref 26.6–33.0)
MCHC: 34 g/dL (ref 31.5–35.7)
MCV: 82 fL (ref 79–97)
Monocytes Absolute: 0.5 10*3/uL (ref 0.1–0.9)
Monocytes: 10 %
Neutrophils Absolute: 3.2 10*3/uL (ref 1.4–7.0)
Neutrophils: 60 %
Platelets: 286 10*3/uL (ref 150–450)
RBC: 5.16 x10E6/uL (ref 4.14–5.80)
RDW: 11.9 % (ref 11.6–15.4)
WBC: 5.3 10*3/uL (ref 3.4–10.8)

## 2021-11-29 LAB — COMPREHENSIVE METABOLIC PANEL
ALT: 34 IU/L (ref 0–44)
AST: 22 IU/L (ref 0–40)
Albumin/Globulin Ratio: 1.4 (ref 1.2–2.2)
Albumin: 4.3 g/dL (ref 4.0–5.0)
Alkaline Phosphatase: 83 IU/L (ref 44–121)
BUN/Creatinine Ratio: 14 (ref 9–20)
BUN: 16 mg/dL (ref 6–20)
Bilirubin Total: <0.2 mg/dL (ref 0.0–1.2)
CO2: 25 mmol/L (ref 20–29)
Calcium: 9.6 mg/dL (ref 8.7–10.2)
Chloride: 100 mmol/L (ref 96–106)
Creatinine, Ser: 1.15 mg/dL (ref 0.76–1.27)
Globulin, Total: 3.1 g/dL (ref 1.5–4.5)
Glucose: 87 mg/dL (ref 70–99)
Potassium: 4.1 mmol/L (ref 3.5–5.2)
Sodium: 142 mmol/L (ref 134–144)
Total Protein: 7.4 g/dL (ref 6.0–8.5)
eGFR: 85 mL/min/{1.73_m2} (ref >59)

## 2021-11-29 LAB — CYCLIC CITRUL PEPTIDE ANTIBODY, IGG/IGA: Cyclic Citrullin Peptide Ab: 6 units (ref 0–19)

## 2021-11-29 LAB — HEPATITIS C ANTIBODY: Hep C Virus Ab: NONREACTIVE

## 2021-11-29 LAB — HIV ANTIBODY (ROUTINE TESTING W REFLEX): HIV Screen 4th Generation wRfx: NONREACTIVE

## 2021-11-29 LAB — RHEUMATOID FACTOR: Rheumatoid fact SerPl-aCnc: <10 IU/mL (ref <14.0)

## 2021-11-29 LAB — SEDIMENTATION RATE: Sed Rate: 30 mm/hr — ABNORMAL HIGH (ref 0–15)

## 2021-11-29 LAB — ANA W/REFLEX IF POSITIVE: Anti Nuclear Antibody (ANA): NEGATIVE

## 2021-11-29 LAB — LYME DISEASE SEROLOGY W/REFLEX: Lyme Total Antibody EIA: NEGATIVE

## 2021-11-29 LAB — LYME DISEASE DNA BY PCR(BORRELIA BURG): Lyme (B. burgdorferi) PCR: NEGATIVE

## 2021-12-10 DIAGNOSIS — M7122 Synovial cyst of popliteal space [Baker], left knee: Secondary | ICD-10-CM | POA: Diagnosis not present

## 2021-12-24 ENCOUNTER — Ambulatory Visit: Payer: BC Managed Care – PPO | Admitting: Cardiology

## 2022-01-21 DIAGNOSIS — B309 Viral conjunctivitis, unspecified: Secondary | ICD-10-CM | POA: Diagnosis not present

## 2022-05-10 DIAGNOSIS — L298 Other pruritus: Secondary | ICD-10-CM | POA: Diagnosis not present

## 2022-05-10 DIAGNOSIS — L910 Hypertrophic scar: Secondary | ICD-10-CM | POA: Diagnosis not present

## 2022-05-10 DIAGNOSIS — W57XXXA Bitten or stung by nonvenomous insect and other nonvenomous arthropods, initial encounter: Secondary | ICD-10-CM | POA: Diagnosis not present

## 2022-07-10 DIAGNOSIS — T161XXA Foreign body in right ear, initial encounter: Secondary | ICD-10-CM | POA: Diagnosis not present

## 2022-07-19 DIAGNOSIS — L910 Hypertrophic scar: Secondary | ICD-10-CM | POA: Diagnosis not present

## 2022-07-19 DIAGNOSIS — L298 Other pruritus: Secondary | ICD-10-CM | POA: Diagnosis not present

## 2022-09-21 DIAGNOSIS — L910 Hypertrophic scar: Secondary | ICD-10-CM | POA: Diagnosis not present

## 2023-12-30 IMAGING — CR DG CHEST 2V
1 series · 2 of 2 positions shown · non-contrast
Comparison: None.

CLINICAL DATA: 35-year-old male status post influenza with Bisnes
with continued cough.

EXAM:
CHEST - 2 VIEW

[Series 1: dg chest 2 view · 0.14mm/px · 2 of 2 slices shown]
[im 1/2]
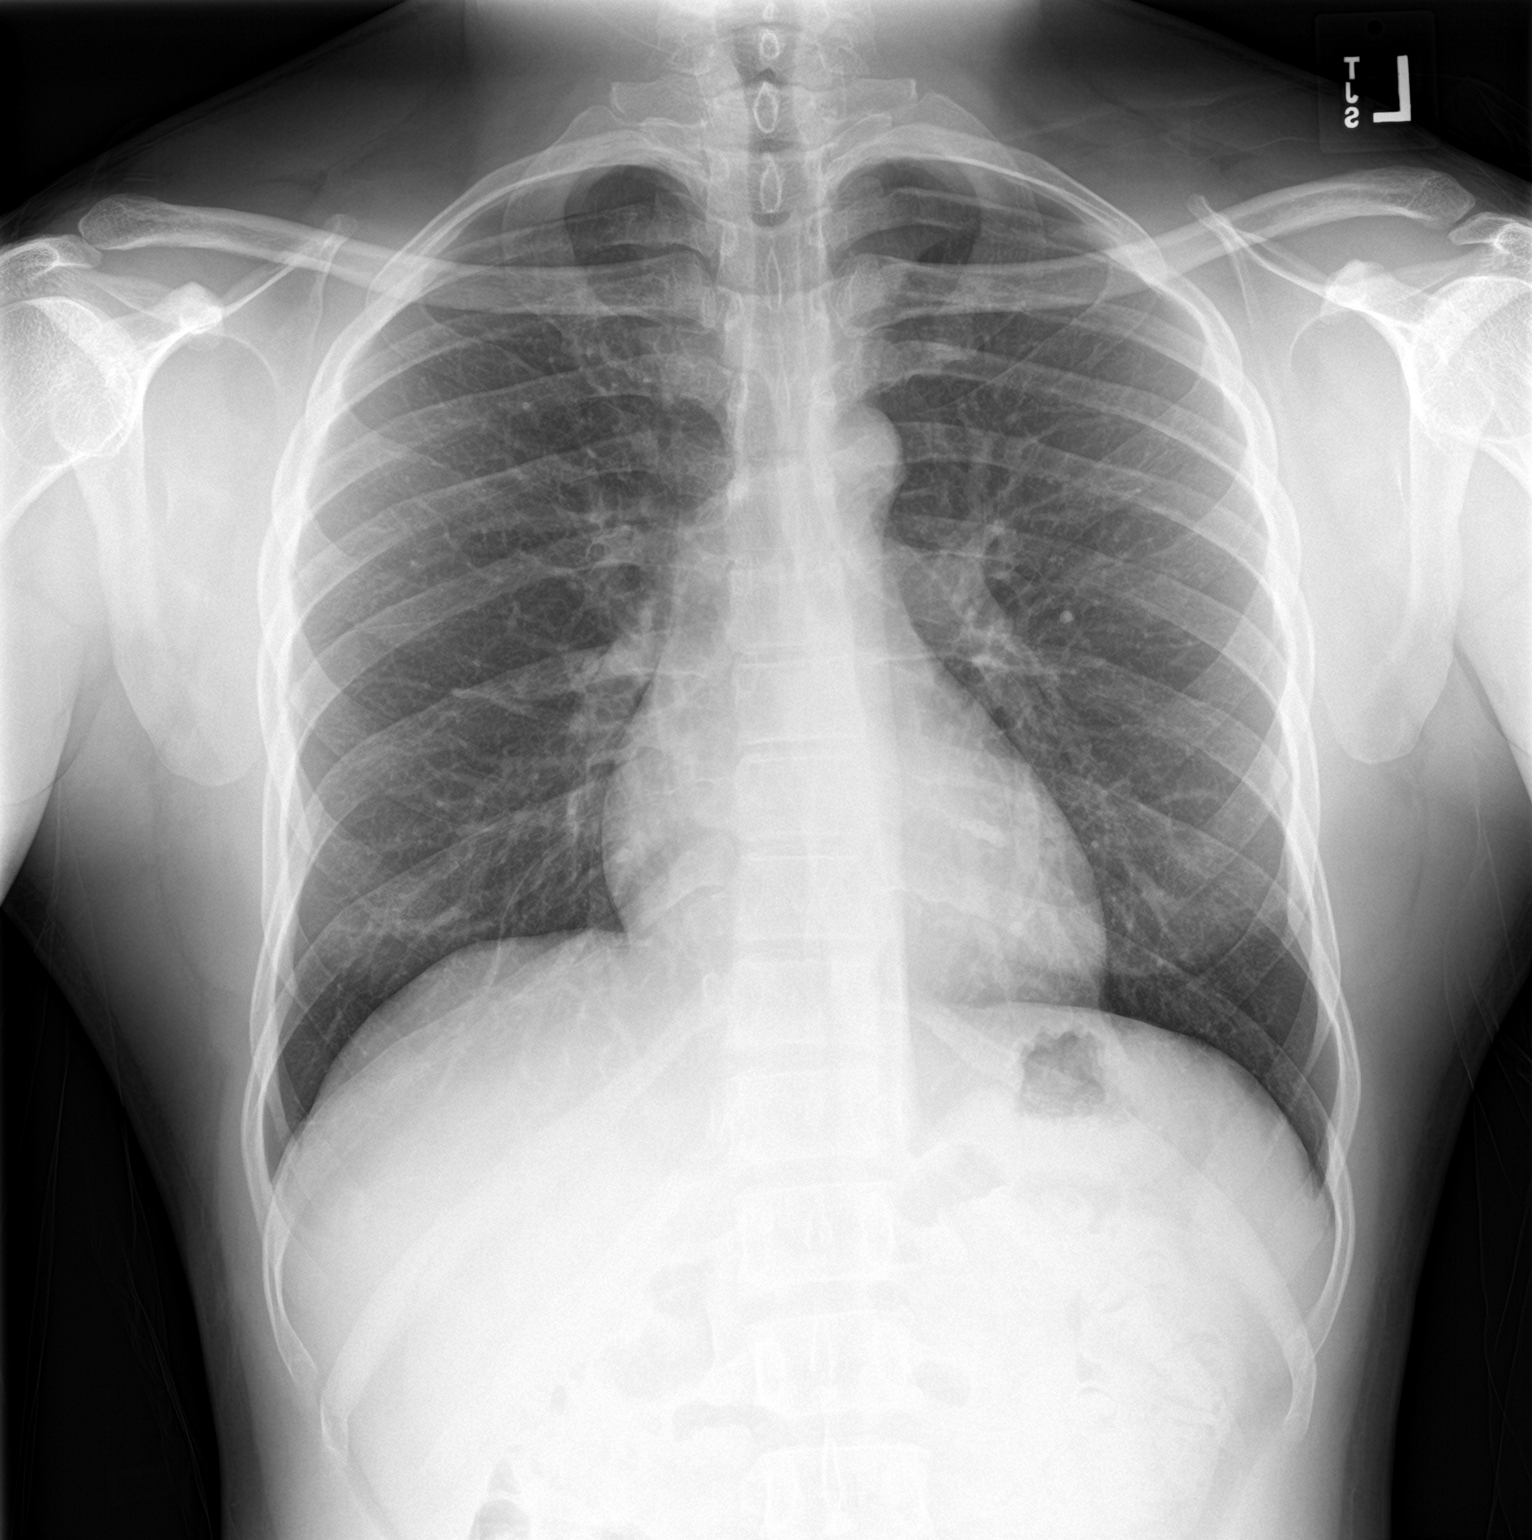
[im 2/2]
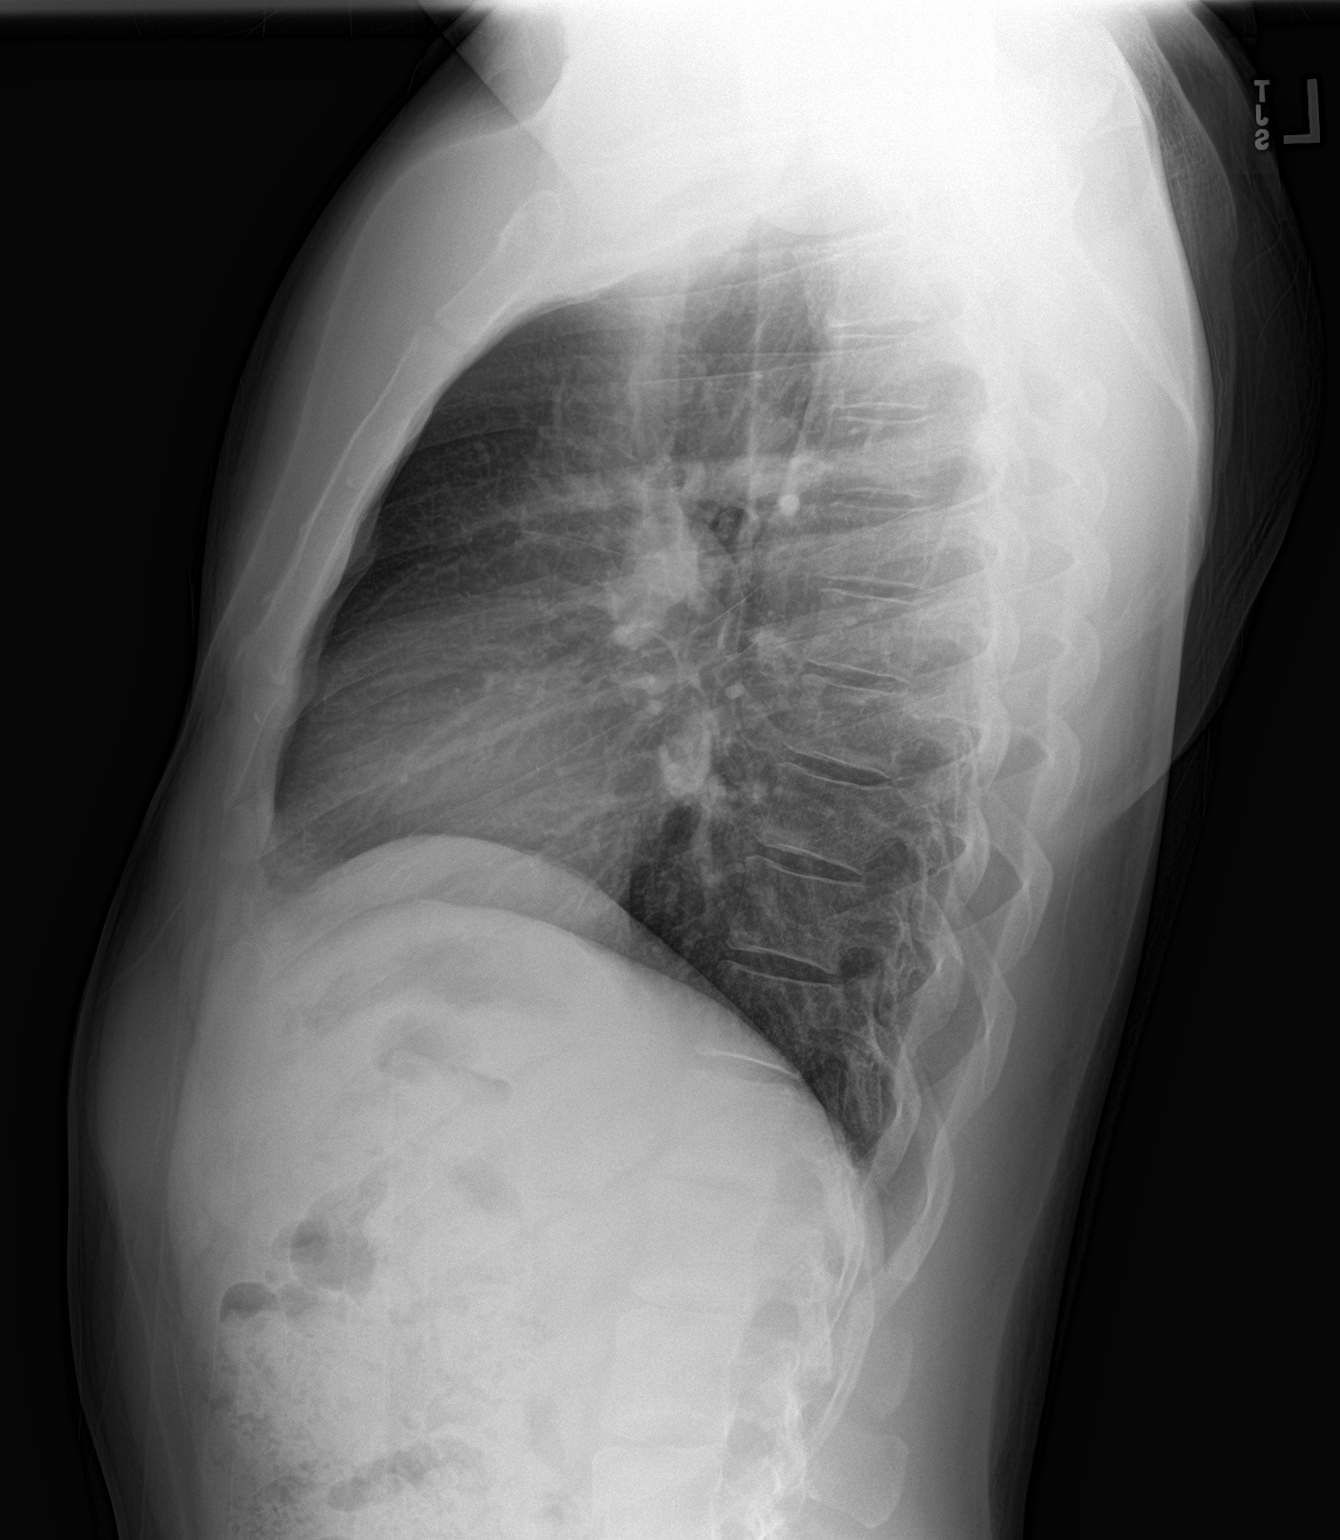

[2 of 2 positions shown; findings below may reference images not displayed]

FINDINGS: Normal lung volumes and mediastinal contours. Visualized tracheal
air column is within normal limits. Both lungs appear clear. No
pneumothorax or pleural effusion.

No osseous abnormality identified.  Negative visible bowel gas.
IMPRESSION: Negative.  No cardiopulmonary abnormality.
